# Patient Record
Sex: Female | Born: 1985 | Race: White | Hispanic: No | Smoking: Never smoker
Health system: Southern US, Community
[De-identification: ages and names within clinical notes are randomized; demographics above are authoritative.]

## PROBLEM LIST (undated history)

## (undated) DIAGNOSIS — Z8659 Personal history of other mental and behavioral disorders: Secondary | ICD-10-CM

## (undated) DIAGNOSIS — Z6791 Unspecified blood type, Rh negative: Secondary | ICD-10-CM

## (undated) DIAGNOSIS — N390 Urinary tract infection, site not specified: Secondary | ICD-10-CM

## (undated) HISTORY — DX: Personal history of other mental and behavioral disorders: Z86.59

## (undated) HISTORY — PX: OTHER SURGICAL HISTORY: SHX169

## (undated) HISTORY — DX: Unspecified blood type, rh negative: Z67.91

---
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
## 2006-11-16 ENCOUNTER — Other Ambulatory Visit: Admission: RE | Admit: 2006-11-16 | Discharge: 2006-11-16 | Payer: Self-pay | Admitting: *Deleted

## 2008-01-18 ENCOUNTER — Ambulatory Visit (HOSPITAL_COMMUNITY): Admission: RE | Admit: 2008-01-18 | Discharge: 2008-01-18 | Payer: Self-pay | Admitting: Obstetrics and Gynecology

## 2008-03-01 ENCOUNTER — Other Ambulatory Visit: Admission: RE | Admit: 2008-03-01 | Discharge: 2008-03-01 | Payer: Self-pay | Admitting: Obstetrics and Gynecology

## 2008-06-01 ENCOUNTER — Inpatient Hospital Stay (HOSPITAL_COMMUNITY): Admission: AD | Admit: 2008-06-01 | Discharge: 2008-06-01 | Payer: Self-pay | Admitting: Obstetrics and Gynecology

## 2008-08-06 ENCOUNTER — Inpatient Hospital Stay (HOSPITAL_COMMUNITY): Admission: AD | Admit: 2008-08-06 | Discharge: 2008-08-09 | Payer: Self-pay | Admitting: Obstetrics and Gynecology

## 2008-08-07 ENCOUNTER — Encounter (HOSPITAL_COMMUNITY): Payer: Self-pay | Admitting: Obstetrics and Gynecology

## 2008-10-08 ENCOUNTER — Encounter: Admission: RE | Admit: 2008-10-08 | Discharge: 2008-10-08 | Payer: Self-pay | Admitting: General Surgery

## 2010-02-17 ENCOUNTER — Other Ambulatory Visit: Admission: RE | Admit: 2010-02-17 | Discharge: 2010-02-17 | Payer: Self-pay | Admitting: Family Medicine

## 2010-10-26 LAB — CBC
HCT: 32.4 % — ABNORMAL LOW (ref 36.0–46.0)
HCT: 37.5 % (ref 36.0–46.0)
Hemoglobin: 12.7 g/dL (ref 12.0–15.0)
MCHC: 33.8 g/dL (ref 30.0–36.0)
MCHC: 34 g/dL (ref 30.0–36.0)
MCV: 95.4 fL (ref 78.0–100.0)
RBC: 3.4 MIL/uL — ABNORMAL LOW (ref 3.87–5.11)
RDW: 13.8 % (ref 11.5–15.5)

## 2011-02-05 ENCOUNTER — Encounter (HOSPITAL_COMMUNITY): Payer: Self-pay | Admitting: *Deleted

## 2011-02-05 ENCOUNTER — Inpatient Hospital Stay (HOSPITAL_COMMUNITY): Payer: Medicaid Other

## 2011-02-05 ENCOUNTER — Inpatient Hospital Stay (HOSPITAL_COMMUNITY)
Admission: AD | Admit: 2011-02-05 | Discharge: 2011-02-05 | Disposition: A | Discharge status: Home / Self Care | Payer: Medicaid Other | Source: Ambulatory Visit | Attending: Obstetrics and Gynecology | Admitting: Obstetrics and Gynecology

## 2011-02-05 DIAGNOSIS — Z6791 Unspecified blood type, Rh negative: Secondary | ICD-10-CM

## 2011-02-05 DIAGNOSIS — O209 Hemorrhage in early pregnancy, unspecified: Secondary | ICD-10-CM | POA: Insufficient documentation

## 2011-02-05 DIAGNOSIS — R7989 Other specified abnormal findings of blood chemistry: Secondary | ICD-10-CM | POA: Insufficient documentation

## 2011-02-05 DIAGNOSIS — Z789 Other specified health status: Secondary | ICD-10-CM | POA: Insufficient documentation

## 2011-02-05 DIAGNOSIS — O210 Mild hyperemesis gravidarum: Secondary | ICD-10-CM | POA: Insufficient documentation

## 2011-02-05 LAB — WET PREP, GENITAL
Clue Cells Wet Prep HPF POC: NONE SEEN
Trich, Wet Prep: NONE SEEN
Yeast Wet Prep HPF POC: NONE SEEN

## 2011-02-05 LAB — URINALYSIS, ROUTINE W REFLEX MICROSCOPIC
Bilirubin Urine: NEGATIVE
Glucose, UA: NEGATIVE mg/dL
Ketones, ur: NEGATIVE mg/dL
Leukocytes, UA: NEGATIVE
Protein, ur: NEGATIVE mg/dL

## 2011-02-05 MED ORDER — RHO D IMMUNE GLOBULIN 1500 UNIT/2ML IJ SOLN
300.0000 ug | Freq: Once | INTRAMUSCULAR | Status: AC
  Administered 2011-02-05: 300 ug via INTRAMUSCULAR

## 2011-02-05 NOTE — ED Provider Notes (Signed)
History    Pt is 25 y.o. WF G2P1001 at 7.6 weeks per LMP of 12/12/10 (EDC 09/18/11).   Chief Complaint  Patient presents with  . Vaginal Discharge   Vaginal Discharge The patient's primary symptoms include vaginal bleeding. This is a new problem. The current episode started today (Noticed when awoke this AM & only when wiping today). The problem occurs intermittently. The problem has been gradually worsening. The pain is mild (Reports recent "cramping" that describes as worse than ctxs on Pitocin). She is pregnant. Associated symptoms include nausea. The vaginal discharge was brown and mucoid. The vaginal bleeding is spotting. Passing clots: stringy. She has not been passing tissue. The symptoms are aggravated by intercourse (last incident Wednesday night). She has tried nothing for the symptoms. (Bell's palsy 3rd trimester last pregnancy--followed by neurologist & took prednisone & second agent & resolved; no residual issues or recurrences)    OB History    Grav Para Term Preterm Abortions TAB SAB Ect Mult Living   2 1 1       1       No past medical history on file.  Past Surgical History  Procedure Date  . Wisdom teeth      No family history on file.  History  Substance Use Topics  . Smoking status: Never Smoker   . Smokeless tobacco: Not on file  . Alcohol Use: No    Allergies: No Known Allergies  Prescriptions prior to admission  Medication Sig Dispense Refill  . Docosahexaenoic Acid (DHA OMEGA 3 PO) Take 1 capsule by mouth 2 (two) times daily.        . prenatal vitamin w/FE, FA (PRENATAL 1 + 1) 27-1 MG TABS Take 1 tablet by mouth daily.          Review of Systems  Constitutional: Negative.   Respiratory: Negative.   Cardiovascular: Negative.   Gastrointestinal: Positive for nausea.       Occasional; denies emesis  Genitourinary: Negative.    Physical Exam   Blood pressure 117/73, pulse 70, temperature 99 F (37.2 C), temperature source Oral, resp. rate 20, height  6\' 2"  (1.88 m), weight 50.066 kg (110 lb 6 oz).  Physical Exam  Constitutional: She is oriented to person, place, and time. She appears well-developed and well-nourished.  Eyes:       Eye glasses  GI: Soft. Bowel sounds are normal. There is no tenderness. There is no rebound.  Genitourinary: Vaginal discharge found.       Small amt of light brown-tinged discharged noted in vault. cx firm, closed.  Vulva red.  Positive odor.  Neurological: She is alert and oriented to person, place, and time.  Skin: Skin is warm and dry.       Facial acne    MAU Course  Procedures 1.  Speculum exam--Wet Prep WNL; GC/CT cx's pending 2.  Bimanual exam 3.  OB u/s:  SIUP with crl=8 wks (per LMP 7.6 weeks); FHR =176bpm; +YS; "Lg SCH (>50%); Rt ovary w/ simple-appearing cyst =3.6x2.9.2.8cm; Lt ov & cx nml.  Adnexa nml. 4.  Rhopylac w/u and injection received 5.  Urinalysis WNL Assessment and Plan  1.  IUP at 7.6 wks 2.  RH neg 3.  1st trimester vaginal bleeding w/ lg SCH on u/s  After results reviewed, pt d/c'd home pelvic rest until 7 days without vaginal bleeding.  SAB & bleeding precautions rev'd. F/u 02/10/11 as scheduled or prn.  Continue PNV & Fish oil.   Wilman Tucker  H 02/05/2011, 10:47 PM

## 2011-02-05 NOTE — Progress Notes (Signed)
Pt states, " This morning I started having brown discharge when I wipe. It has gradually gotten heavier as the day went on. I'm not having any cramping."

## 2011-02-06 LAB — RH IG WORKUP (INCLUDES ABO/RH): Unit division: 0

## 2011-02-06 LAB — GC/CHLAMYDIA PROBE AMP, GENITAL: Chlamydia, DNA Probe: NEGATIVE

## 2011-03-16 ENCOUNTER — Other Ambulatory Visit (HOSPITAL_COMMUNITY): Payer: Self-pay | Admitting: Obstetrics and Gynecology

## 2011-03-16 DIAGNOSIS — Z3682 Encounter for antenatal screening for nuchal translucency: Secondary | ICD-10-CM

## 2011-03-17 ENCOUNTER — Ambulatory Visit (HOSPITAL_COMMUNITY)
Admission: RE | Admit: 2011-03-17 | Discharge: 2011-03-17 | Disposition: A | Discharge status: Home / Self Care | Payer: Medicaid Other | Source: Ambulatory Visit | Attending: Obstetrics and Gynecology | Admitting: Obstetrics and Gynecology

## 2011-03-17 ENCOUNTER — Encounter (HOSPITAL_COMMUNITY): Payer: Self-pay

## 2011-03-17 ENCOUNTER — Other Ambulatory Visit: Payer: Self-pay

## 2011-03-17 DIAGNOSIS — O3510X0 Maternal care for (suspected) chromosomal abnormality in fetus, unspecified, not applicable or unspecified: Secondary | ICD-10-CM | POA: Insufficient documentation

## 2011-03-17 DIAGNOSIS — Z3682 Encounter for antenatal screening for nuchal translucency: Secondary | ICD-10-CM

## 2011-03-17 DIAGNOSIS — O351XX0 Maternal care for (suspected) chromosomal abnormality in fetus, not applicable or unspecified: Secondary | ICD-10-CM | POA: Insufficient documentation

## 2011-03-17 DIAGNOSIS — Z3689 Encounter for other specified antenatal screening: Secondary | ICD-10-CM | POA: Insufficient documentation

## 2011-04-13 LAB — URINALYSIS, ROUTINE W REFLEX MICROSCOPIC
Bilirubin Urine: NEGATIVE
Ketones, ur: NEGATIVE
Nitrite: NEGATIVE
Protein, ur: NEGATIVE

## 2011-07-05 ENCOUNTER — Inpatient Hospital Stay (HOSPITAL_COMMUNITY)
Admission: AD | Admit: 2011-07-05 | Discharge: 2011-07-05 | Disposition: A | Discharge status: Home / Self Care | Payer: Medicaid Other | Source: Ambulatory Visit | Attending: Obstetrics and Gynecology | Admitting: Obstetrics and Gynecology

## 2011-07-05 DIAGNOSIS — Z298 Encounter for other specified prophylactic measures: Secondary | ICD-10-CM | POA: Insufficient documentation

## 2011-07-05 DIAGNOSIS — Z2989 Encounter for other specified prophylactic measures: Secondary | ICD-10-CM | POA: Insufficient documentation

## 2011-07-05 DIAGNOSIS — Z3480 Encounter for supervision of other normal pregnancy, unspecified trimester: Secondary | ICD-10-CM | POA: Insufficient documentation

## 2011-07-05 MED ORDER — RHO D IMMUNE GLOBULIN 1500 UNIT/2ML IJ SOLN
300.0000 ug | Freq: Once | INTRAMUSCULAR | Status: AC
  Administered 2011-07-05: 300 ug via INTRAMUSCULAR
  Filled 2011-07-05: qty 2

## 2011-07-05 NOTE — Progress Notes (Signed)
Here for rhophyllac injection. Had with first preg, and early in this preg when bleeding.  No problem when received previously.  Is 29wks.  edc 09/18/11.  Time associated with blood draw and injection discussed

## 2011-07-05 NOTE — ED Notes (Signed)
No adverse effect from Rhophylac, pt states no questions at this time.

## 2011-07-06 LAB — RH IG WORKUP (INCLUDES ABO/RH): Unit division: 0

## 2011-07-13 NOTE — L&D Delivery Note (Signed)
Delivery Note At 8:09 PM a viable female, "Alexandra Sullivan", was delivered via Vaginal, Spontaneous Delivery (Presentation: Left Occiput Anterior).  APGAR: 9, ; weight .  Delivered in MAU. Placenta status: Intact, Spontaneous.  Cord: 3 vessels with the following complications: Slightly short cord .  Cord pH: NA Arrived just after SROM, clear fluid per patient report.  Patient complete on arrival to MAU, rapid progression to delivery.  Anesthesia: Local  Episiotomy: None Lacerations: 1st degree;Perineal Suture Repair: 3.0 monocryl Est. Blood Loss (mL):   Mom to postpartum.  Baby to skin to skin in MAU.  Nigel Bridgeman 09/09/2011, 9:26 PM

## 2011-08-23 ENCOUNTER — Inpatient Hospital Stay (HOSPITAL_COMMUNITY)
Admission: AD | Admit: 2011-08-23 | Discharge: 2011-08-23 | Disposition: A | Discharge status: Home / Self Care | Payer: Medicaid Other | Source: Ambulatory Visit | Attending: Obstetrics and Gynecology | Admitting: Obstetrics and Gynecology

## 2011-08-23 ENCOUNTER — Inpatient Hospital Stay (HOSPITAL_COMMUNITY): Payer: Medicaid Other

## 2011-08-23 ENCOUNTER — Other Ambulatory Visit: Payer: Self-pay | Admitting: Obstetrics and Gynecology

## 2011-08-23 ENCOUNTER — Encounter (HOSPITAL_COMMUNITY): Payer: Self-pay | Admitting: *Deleted

## 2011-08-23 DIAGNOSIS — O368190 Decreased fetal movements, unspecified trimester, not applicable or unspecified: Secondary | ICD-10-CM | POA: Insufficient documentation

## 2011-08-23 NOTE — ED Notes (Signed)
FHR tracing is reassuring but not reactive yet; Having ucs that are 1-3 minutes apart; pt is not feeling ucs; pt feels vaginal pressure occasionally;

## 2011-08-23 NOTE — Progress Notes (Signed)
Pt was really active yesterday so was unaware of FM.  When laid down last night, had less movement and again today, not as often or as strong.

## 2011-08-24 ENCOUNTER — Other Ambulatory Visit: Payer: Self-pay | Admitting: Obstetrics and Gynecology

## 2011-08-24 NOTE — ED Provider Notes (Signed)
History    26 yo G2P1001 at 28 2/7 weeks presented c/o decreased FM last 24 hours.  Denies leaking or bleeding, reports +FM. Denies contractions.  Pregnancy remarkable for: Rh negative Anxiety Plans waterbirth   OB History    Grav Para Term Preterm Abortions TAB SAB Ect Mult Living   2 1 1       1      Medical history:  Non-contributory   Past Surgical History  Procedure Date  . Wisdom teeth      Family History  Problem Relation Age of Onset  . Diabetes Maternal Grandmother   . Diabetes Maternal Grandfather   . Diabetes Paternal Grandmother   . Hypertension Paternal Grandmother   . Diabetes Paternal Grandfather     History  Substance Use Topics  . Smoking status: Never Smoker   . Smokeless tobacco: Not on file  . Alcohol Use: No    Allergies: No Known Allergies  Physical Exam   Chest clear  Heart RRR without murmur Abd gravid, NT Pelvic--closed, 50%, vtx -2 Ext WNL  FHR reassuring, negative spontaneous CST. UCs q 2-5, mild.  [place]:  Verbal report from [place]:  Vtx, AFI WNL.  EFW c/w [redacted] week gestation, BPP 8/8 (written report not available yet in EPIC).  FHR reactive on monitor after [place]. Now aware of FM     ED Course  IUP at 36 2/7 weeks Reassuring fetal status  Plan: D/C home with [facility] discussion, labor precautions. F/U as scheduled at [facility] or prn.  Nigel Bridgeman, CNM, [place] 08/23/11 11pm

## 2011-09-09 ENCOUNTER — Inpatient Hospital Stay (HOSPITAL_COMMUNITY)
Admission: AD | Admit: 2011-09-09 | Discharge: 2011-09-11 | DRG: 775 | Disposition: A | Discharge status: Home Health | Payer: Medicaid Other | Attending: Obstetrics and Gynecology | Admitting: Obstetrics and Gynecology

## 2011-09-09 ENCOUNTER — Encounter (HOSPITAL_COMMUNITY): Payer: Self-pay | Admitting: *Deleted

## 2011-09-09 MED ORDER — BENZOCAINE-MENTHOL 20-0.5 % EX AERO: 1.0000 "application " | INHALATION_SPRAY | CUTANEOUS | Status: DC | PRN

## 2011-09-09 MED ORDER — MEASLES, MUMPS & RUBELLA VAC ~~LOC~~ INJ
0.5000 mL | INJECTION | Freq: Once | SUBCUTANEOUS | Status: DC
  Filled 2011-09-09: qty 0.5

## 2011-09-09 MED ORDER — ZOLPIDEM TARTRATE 5 MG PO TABS: 5.0000 mg | ORAL_TABLET | Freq: Every evening | ORAL | Status: DC | PRN

## 2011-09-09 MED ORDER — MAGNESIUM HYDROXIDE 400 MG/5ML PO SUSP: 30.0000 mL | ORAL | Status: DC | PRN

## 2011-09-09 MED ORDER — ONDANSETRON HCL 4 MG/2ML IJ SOLN: 4.0000 mg | INTRAMUSCULAR | Status: DC | PRN

## 2011-09-09 MED ORDER — SENNOSIDES-DOCUSATE SODIUM 8.6-50 MG PO TABS
2.0000 | ORAL_TABLET | Freq: Every day | ORAL | Status: DC
  Administered 2011-09-10: 2 via ORAL

## 2011-09-09 MED ORDER — SIMETHICONE 80 MG PO CHEW: 80.0000 mg | CHEWABLE_TABLET | ORAL | Status: DC | PRN

## 2011-09-09 MED ORDER — OXYTOCIN 10 UNIT/ML IJ SOLN
10.0000 [IU] | Freq: Once | INTRAMUSCULAR | Status: AC
  Administered 2011-09-09: 10 [IU] via INTRAMUSCULAR
  Filled 2011-09-09: qty 1

## 2011-09-09 MED ORDER — IBUPROFEN 600 MG PO TABS
600.0000 mg | ORAL_TABLET | Freq: Four times a day (QID) | ORAL | Status: DC
  Administered 2011-09-09 – 2011-09-11 (×7): 600 mg via ORAL
  Filled 2011-09-09 (×6): qty 1

## 2011-09-09 MED ORDER — OXYCODONE-ACETAMINOPHEN 5-325 MG PO TABS: 1.0000 | ORAL_TABLET | ORAL | Status: DC | PRN

## 2011-09-09 MED ORDER — PRENATAL MULTIVITAMIN CH
1.0000 | ORAL_TABLET | Freq: Every day | ORAL | Status: DC
  Administered 2011-09-10 – 2011-09-11 (×2): 1 via ORAL
  Filled 2011-09-09 (×2): qty 1

## 2011-09-09 MED ORDER — TETANUS-DIPHTH-ACELL PERTUSSIS 5-2.5-18.5 LF-MCG/0.5 IM SUSP: 0.5000 mL | Freq: Once | INTRAMUSCULAR | Status: DC

## 2011-09-09 MED ORDER — DIPHENHYDRAMINE HCL 25 MG PO CAPS: 25.0000 mg | ORAL_CAPSULE | Freq: Four times a day (QID) | ORAL | Status: DC | PRN

## 2011-09-09 MED ORDER — WITCH HAZEL-GLYCERIN EX PADS: 1.0000 "application " | MEDICATED_PAD | CUTANEOUS | Status: DC | PRN

## 2011-09-09 MED ORDER — LANOLIN HYDROUS EX OINT: TOPICAL_OINTMENT | CUTANEOUS | Status: DC | PRN

## 2011-09-09 MED ORDER — DIBUCAINE 1 % RE OINT: 1.0000 "application " | TOPICAL_OINTMENT | RECTAL | Status: DC | PRN

## 2011-09-09 MED ORDER — LIDOCAINE HCL (PF) 1 % IJ SOLN
INTRAMUSCULAR | Status: AC
  Administered 2011-09-09: 30 mL
  Filled 2011-09-09: qty 30

## 2011-09-09 MED ORDER — ONDANSETRON HCL 4 MG PO TABS: 4.0000 mg | ORAL_TABLET | ORAL | Status: DC | PRN

## 2011-09-09 NOTE — ED Notes (Signed)
Nursing nurse notified of term delivery in MAU; 38.5 weeks, rapidly delivered in MAU. Pt and Midwife requests baby to stay with mom without going to the nursery. Nursing nurse will come to MAU when available. Family declines erythromycin for the baby. Midwife V. [facility] CNM aware.

## 2011-09-09 NOTE — ED Notes (Signed)
Manfred Arch CNM notified of increased amount of bleeding from scant to moderate. Last check uterus was firm with massage. Orders to given Pitocin 10units IM.

## 2011-09-09 NOTE — ED Notes (Signed)
Patient arrived at MAU at 2001 complaining of the need to push. Manfred Arch notified. Patient completely dilated upon exam. Pushed initiated with Manfred Arch CNM at bedside.  NBF delivered at 2009

## 2011-09-10 LAB — CBC
HCT: 34.7 % — ABNORMAL LOW (ref 36.0–46.0)
MCHC: 34.3 g/dL (ref 30.0–36.0)
MCV: 90.6 fL (ref 78.0–100.0)
Platelets: 223 10*3/uL (ref 150–400)
RDW: 12.8 % (ref 11.5–15.5)
WBC: 20.6 10*3/uL — ABNORMAL HIGH (ref 4.0–10.5)

## 2011-09-10 NOTE — H&P (Signed)
Alexandra Sullivan is a 26 y.o. female , G2P1001 at 70 5/7 weeks, presenting in active labor, with recent SROM on way to hospital, clear fluid.  Patient feels urge to push on arrival.  Accompanied by doula and husband.  Pregnancy remarkable for: RH negative, received Rhophylac during pregnancy Anxiety disorder 1st trimester bleeding  GBS negative Planned waterbirth  History of present pregnancy: Patient entered care at 8 weeks.  EDC of 09/17/11 was established by LMP and 1st trimester [place].  Anatomy scan was done at 18  weeks, with normal findings and an anterior placenta.  Her prenatal course was essentially uncomplicated.  Further signficant events were negative GBS.  She attended WB class and signed a consent for same.  OB History    Grav Para Term Preterm Abortions TAB SAB Ect Mult Living   2 2 2       2     #1--2010 SVB, long labor. #2--current  No past medical history on file.  No significant medical history  Past Surgical History  Procedure Date  . Wisdom teeth     Family History: family history includes Diabetes in her maternal grandfather, maternal grandmother, paternal grandfather, and paternal grandmother and Hypertension in her paternal grandmother.  Social History:  reports that she has never smoked. She does not have any smokeless tobacco history on file. She reports that she does not drink alcohol or use illicit drugs.  FOB is involved and supportive.    ROS:  Contractions, urge to push, leaking clear fluid, positive FM    Blood pressure 136/81, pulse 77, temperature 98.8 F (37.1 C), temperature source Oral, resp. rate 20, last menstrual period 12/12/2010, unknown if currently breastfeeding.  Chest clear Heart RRR without murmur Abd gravid, NT, frequent UCs Pelvic--complete, vtx +2 station, clear fluid leaking Ext WNL  FHR 140s by doppler, some variables with UCs, rapid recovery with end of contraction  Prenatal labs: ABO, Rh: --/--/O NEG (12/24 1220) Antibody:  NEG (12/24 1220) Rubella:  Immune RPR:   NR HBsAg:   Neg HIV:   Neg GBS:   Neg Normal glucola Normal Hgb Declined genetic screenings  Assessment/Plan: IUP at 38 5/7 weeks Second stage labor GBS negative  Plan: Admitted to [facility]. Delivered in MAU Routine CNM orders  Sanel Stemmer 09/10/2011, 2:30 AM

## 2011-09-10 NOTE — Progress Notes (Signed)
Post Partum Day 1 Subjective: no complaints, up ad lib and tolerating PO  Objective: Blood pressure 111/71, pulse 68, temperature 98.5 F (36.9 C), temperature source Oral, resp. rate 18, last menstrual period 12/12/2010, unknown if currently breastfeeding.  Physical Exam:  General: alert and cooperative Lochia: appropriate Uterine Fundus: firm Incision: NA DVT Evaluation: No evidence of DVT seen on physical exam.   Basename 09/10/11 0533  HGB 11.9*  HCT 34.7*    Assessment/Plan: Plan for discharge tomorrow Vasectomy planned.   LOS: 1 day   Alexandra Sullivan V 09/10/2011, 9:47 AM

## 2011-09-10 NOTE — Progress Notes (Signed)
UR Chart review completed.  

## 2011-09-11 MED ORDER — IBUPROFEN 600 MG PO TABS: 600.0000 mg | ORAL_TABLET | Freq: Four times a day (QID) | ORAL | Status: AC

## 2011-09-11 NOTE — Discharge Summary (Signed)
Obstetric Discharge Summary Reason for Admission: onset of labor Prenatal Procedures: ultrasound Intrapartum Procedures: spontaneous vaginal delivery Postpartum Procedures: none Complications-Operative and Postpartum: 1st degree perineal laceration Hemoglobin  Date Value Range Status  09/10/2011 11.9* 12.0-15.0 (g/dL) Final     HCT  Date Value Range Status  09/10/2011 34.7* 36.0-46.0 (%) Final    Discharge Diagnoses: Term Pregnancy-delivered  Discharge Information: Date: 09/11/2011 Activity: unrestricted Diet: routine Medications: PNV and Ibuprofen Condition: stable Instructions: refer to practice specific booklet Discharge to: home Follow-up Information    Follow up with [facility] in 6 weeks.       Contraception:  Plans vasectomy  Newborn Data: Live born female  Birth Weight: 6 lb 2.4 oz (2790 g) APGAR: 9,   Home with mother.  Alexandra Petite O. 09/11/2011, 12:23 PM

## 2011-09-11 NOTE — Progress Notes (Addendum)
Post Partum Day 2 Subjective: Feels well.  Ambulating, voiding and tol po liquids and solids without difficulty.  Denies weakness or dizziness.  Breastfeeding without difficulty.  Pos flatus and pos BM.  Reports minimal perineal pain relieved with Motrin.  States she is very happy with her birth experience.    Objective: Blood pressure 107/76, pulse 76, temperature 98 F (36.7 C), temperature source Axillary, resp. rate 16, last menstrual period 12/12/2010, unknown if currently breastfeeding.  Physical Exam:  General: alert, cooperative and no distress Heart:  RRR Lungs:  CTA bilat Abd:  Soft, NT, BS x 4 quads. Lochia: appropriate, scant rubra Uterine Fundus: firm, NT 2 below umb Incision: healing well, perineum intact DVT Evaluation: No evidence of DVT seen on physical exam. Negative Homan's sign. No significant calf/ankle edema.   Basename 09/10/11 0533  HGB 11.9*  HCT 34.7*    Assessment/Plan: Stable s/p vag delivery  Discharge to home. Discharge instructions reviewed. RTO 6wks for f/u. Plans vasectomy for contraception. Rx Motrin, PNV.   LOS: 2 days   Alexandra Sullivan O. 09/11/2011, 12:24 PM

## 2011-09-11 NOTE — Discharge Instructions (Signed)
See Practice Brochure 

## 2011-09-11 NOTE — Progress Notes (Signed)

## 2011-10-18 ENCOUNTER — Encounter (INDEPENDENT_AMBULATORY_CARE_PROVIDER_SITE_OTHER): Payer: Medicaid Other | Admitting: Registered Nurse

## 2011-10-18 ENCOUNTER — Other Ambulatory Visit: Payer: Self-pay | Admitting: Registered Nurse

## 2011-10-18 ENCOUNTER — Inpatient Hospital Stay (HOSPITAL_COMMUNITY)
Admission: AD | Admit: 2011-10-18 | Discharge: 2011-10-18 | Disposition: A | Discharge status: Hospice | Payer: Medicaid Other | Source: Ambulatory Visit | Attending: Obstetrics and Gynecology | Admitting: Obstetrics and Gynecology

## 2011-10-18 ENCOUNTER — Encounter (HOSPITAL_COMMUNITY): Payer: Self-pay | Admitting: *Deleted

## 2011-10-18 DIAGNOSIS — O99893 Other specified diseases and conditions complicating puerperium: Secondary | ICD-10-CM | POA: Insufficient documentation

## 2011-10-18 DIAGNOSIS — N390 Urinary tract infection, site not specified: Secondary | ICD-10-CM

## 2011-10-18 DIAGNOSIS — O2390 Unspecified genitourinary tract infection in pregnancy, unspecified trimester: Secondary | ICD-10-CM

## 2011-10-18 DIAGNOSIS — M545 Low back pain, unspecified: Secondary | ICD-10-CM | POA: Insufficient documentation

## 2011-10-18 HISTORY — DX: Urinary tract infection, site not specified: N39.0

## 2011-10-18 LAB — URINALYSIS, ROUTINE W REFLEX MICROSCOPIC
Glucose, UA: NEGATIVE mg/dL
Ketones, ur: 15 mg/dL — AB
Protein, ur: NEGATIVE mg/dL
Urobilinogen, UA: 0.2 mg/dL (ref 0.0–1.0)

## 2011-10-18 LAB — URINE MICROSCOPIC-ADD ON

## 2011-10-18 MED ORDER — OXYCODONE-ACETAMINOPHEN 5-325 MG PO TABS: 1.0000 | ORAL_TABLET | ORAL | Status: AC | PRN

## 2011-10-18 NOTE — Discharge Instructions (Signed)

## 2011-10-18 NOTE — Progress Notes (Signed)
Presents 5 week pp seen in office today RX for UTI, Keflex, had back pain so I thought it was kidney infection and came in, pain is gone now" O VSS     abd soft, nt     Neg CVAT bilaterally A UTI P discussed rx keflex, RX percocet given, f/o office culture next week with PP visit. Lavera Guise, CNM

## 2011-10-18 NOTE — MAU Note (Signed)
Patient is here with c/o lower back pain. She states that she was at [facility] for her pp 5weeks visit. She was told that she has uti and was dehydrated. She states that she was given rx for keflex. When she got home she started lower back pain. She called without a response. She is breast feeding.

## 2011-10-19 LAB — CBC WITH DIFFERENTIAL/PLATELET
Basophils Absolute: 0 10*3/uL (ref 0.0–0.1)
Eosinophils Relative: 3 % (ref 0–5)
HCT: 39.6 % (ref 36.0–46.0)
Hemoglobin: 13.2 g/dL (ref 12.0–15.0)
Lymphocytes Relative: 26 % (ref 12–46)
Lymphs Abs: 2.4 10*3/uL (ref 0.7–4.0)
MCV: 91.2 fL (ref 78.0–100.0)
Monocytes Absolute: 0.5 10*3/uL (ref 0.1–1.0)
Monocytes Relative: 6 % (ref 3–12)
Neutro Abs: 6.1 10*3/uL (ref 1.7–7.7)
RBC: 4.34 MIL/uL (ref 3.87–5.11)
RDW: 11.6 % (ref 11.5–15.5)
WBC: 9.3 10*3/uL (ref 4.0–10.5)

## 2011-10-21 DIAGNOSIS — Z6791 Unspecified blood type, Rh negative: Secondary | ICD-10-CM | POA: Insufficient documentation

## 2011-10-21 DIAGNOSIS — O468X9 Other antepartum hemorrhage, unspecified trimester: Secondary | ICD-10-CM | POA: Insufficient documentation

## 2011-10-21 DIAGNOSIS — N390 Urinary tract infection, site not specified: Secondary | ICD-10-CM

## 2011-10-21 DIAGNOSIS — O418X90 Other specified disorders of amniotic fluid and membranes, unspecified trimester, not applicable or unspecified: Secondary | ICD-10-CM | POA: Insufficient documentation

## 2011-10-21 DIAGNOSIS — R7989 Other specified abnormal findings of blood chemistry: Secondary | ICD-10-CM

## 2011-10-21 DIAGNOSIS — F419 Anxiety disorder, unspecified: Secondary | ICD-10-CM

## 2011-10-21 DIAGNOSIS — O209 Hemorrhage in early pregnancy, unspecified: Secondary | ICD-10-CM

## 2011-10-22 ENCOUNTER — Ambulatory Visit (INDEPENDENT_AMBULATORY_CARE_PROVIDER_SITE_OTHER): Payer: Medicaid Other | Admitting: Obstetrics and Gynecology

## 2011-10-22 VITALS — BP 102/70 | HR 68 | Temp 98.1°F | Resp 14 | Ht 61.0 in | Wt 130.0 lb

## 2011-10-22 DIAGNOSIS — N390 Urinary tract infection, site not specified: Secondary | ICD-10-CM

## 2011-10-22 LAB — POCT URINALYSIS DIPSTICK
Bilirubin, UA: NEGATIVE
Glucose, UA: NEGATIVE
Spec Grav, UA: 1.01
Urobilinogen, UA: NEGATIVE

## 2011-10-22 NOTE — Progress Notes (Signed)
Subjective:     Alexandra Sullivan is a 26 y.o. female who presents for a postpartum visit.   She is 6 week postpartum following a spontaneous vaginal delivery at term, with a precipitous delivery in MAU.  I have fully reviewed the prenatal and intrapartum course.    Patient is sexually active.  Contraception method desired is vasectomy.  Postpartum depression screening: negative, with PPDS = 2  The following portions of the patient's history were reviewed and updated as appropriate: allergies, current medications, past family history, past medical history, past social history, past surgical history and problem list.  Review of Systems Pertinent items are noted in HPI.   Objective:    BP 102/70  Pulse 68  Temp 98.1 F (36.7 C)  Resp 14  Ht 5\' 1"  (1.549 m)  Wt 130 lb (58.968 kg)  BMI 24.56 kg/m2  Breastfeeding? Yes  General:  alert, cooperative and no distress     Lungs: clear to auscultation bilaterally  Heart:  regular rate and rhythm, S1, S2 normal, no murmur  Abdomen: soft, non-tender; bowel sounds normal; no masses,  no organomegaly   Vulva:  normal  Vagina: normal vagina  Cervix:  normal  Corpus: normal size, contour, position, consistency, mobility, non-tender  Adnexa:  normal adnexa             Assessment:     Normal postpartum exam.  Pap smear not done at today's visit.   Plan:     1. Contraception: vasectomy 3. Follow up in: 6 to 12 months or as needed--needs pap within the next 6-12 months.    Nigel Bridgeman CNM 10/22/2011 1:02 PM

## 2011-10-22 NOTE — Progress Notes (Signed)
Patient ID: Alexandra Sullivan, female   DOB: December 26, 1985, 26 y.o.   MRN: [id number] Date of delivery: 09/09/2011 Female Name: Marilynne Halsted Vaginal delivery:yes Cesarean section:no Tubal ligation:no GDM:no Breast Feeding:yes Bottle Feeding:no Post-Partum Blues:no Abnormal pap:no Normal GU function: yes Normal GI function:no Returning to work:no  Discussed contraception.  Husband is going to get vasectomy.

## 2011-10-24 IMAGING — US US OB COMP LESS 14 WK
1 series · 13 of 28 positions shown · non-contrast
Comparison: none

[Series 1: us ob comp less 14 wks · 34 acquisitions, 13 frames shown]
[im 2/34]
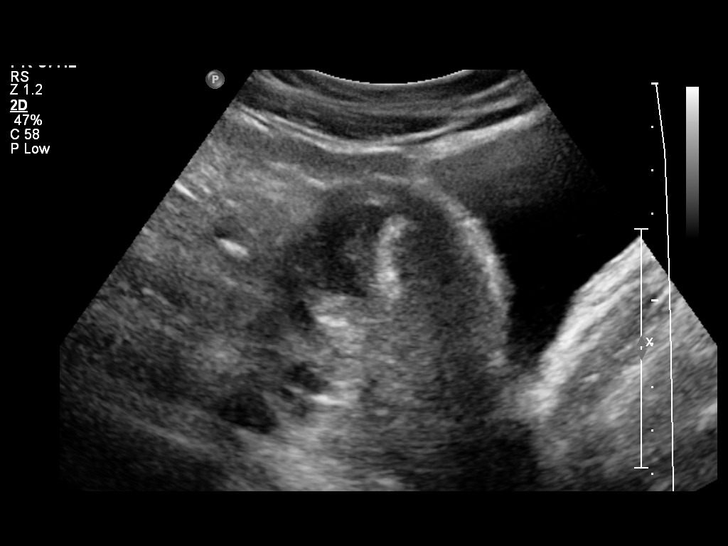
[im 4/34]
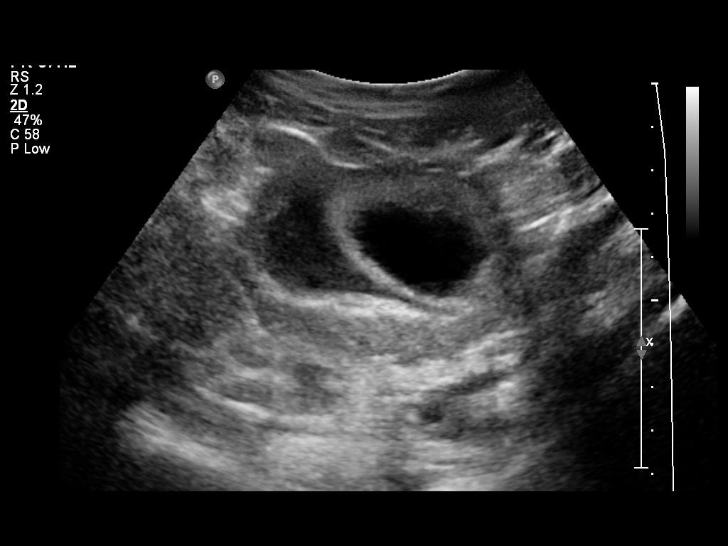
[im 7/34]
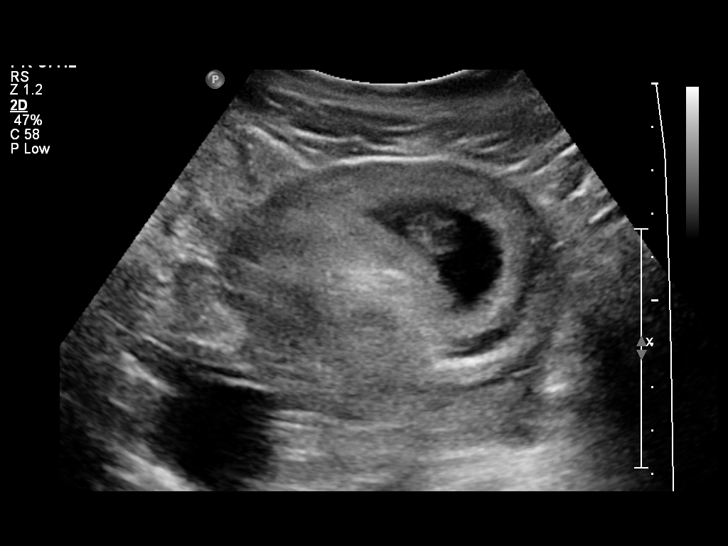
[im 9/34]
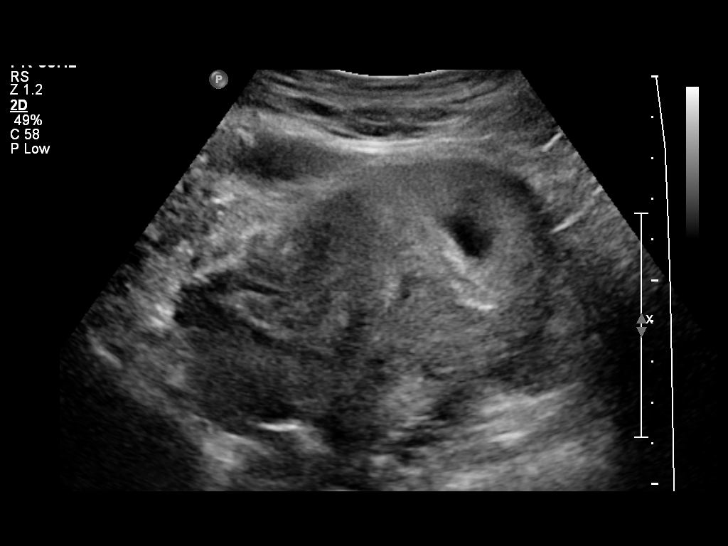
[im 12/34]
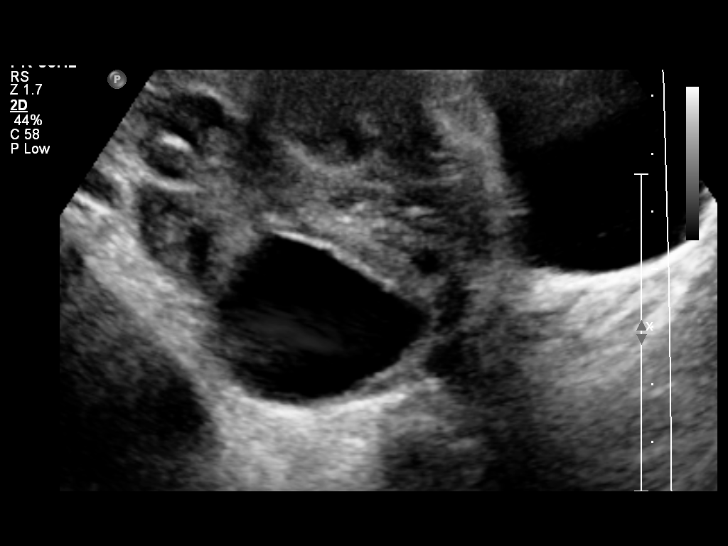
[im 14/34]
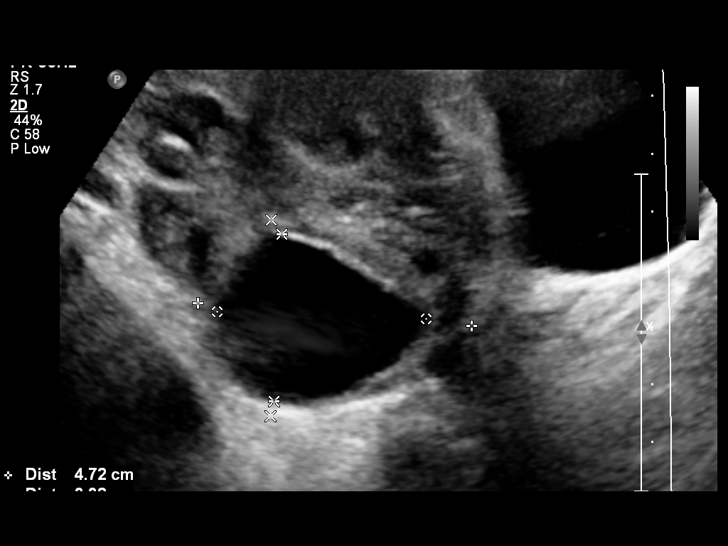
[im 18/34]
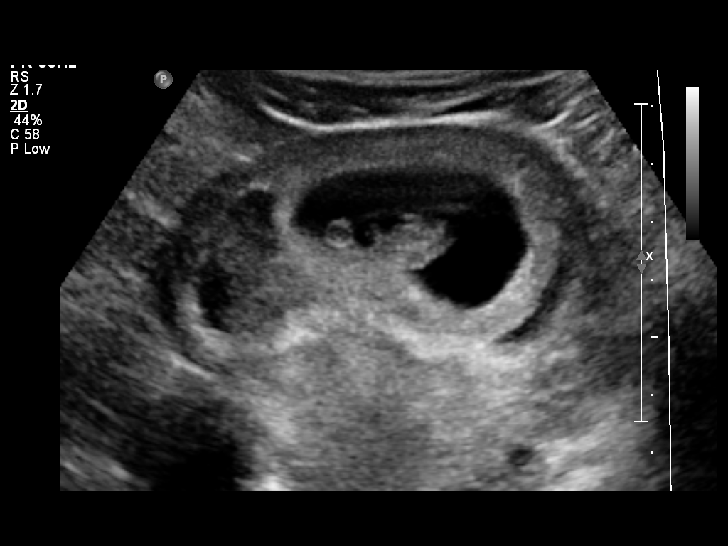
[im 20/34]
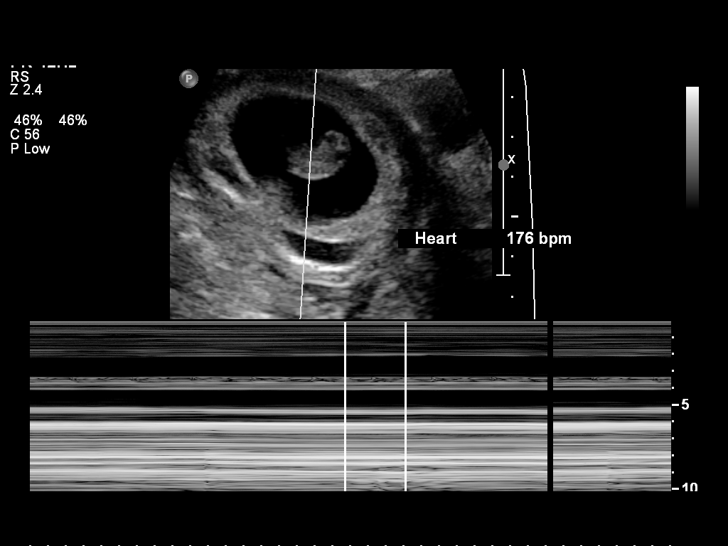
[im 23/34]
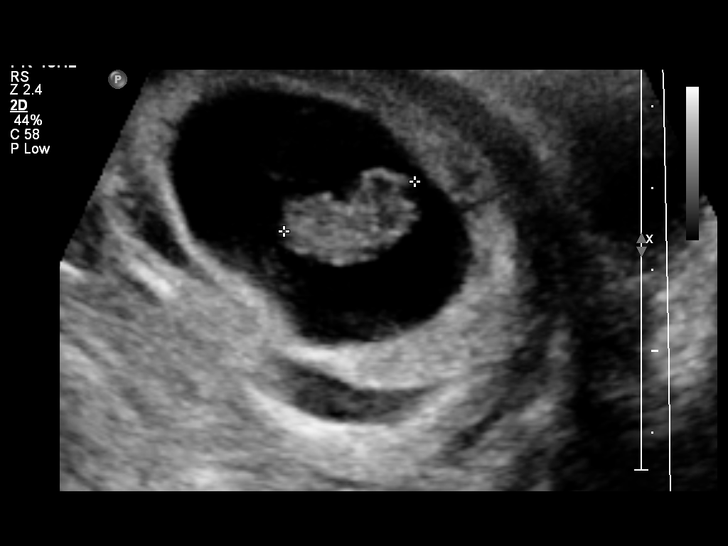
[im 25/34]
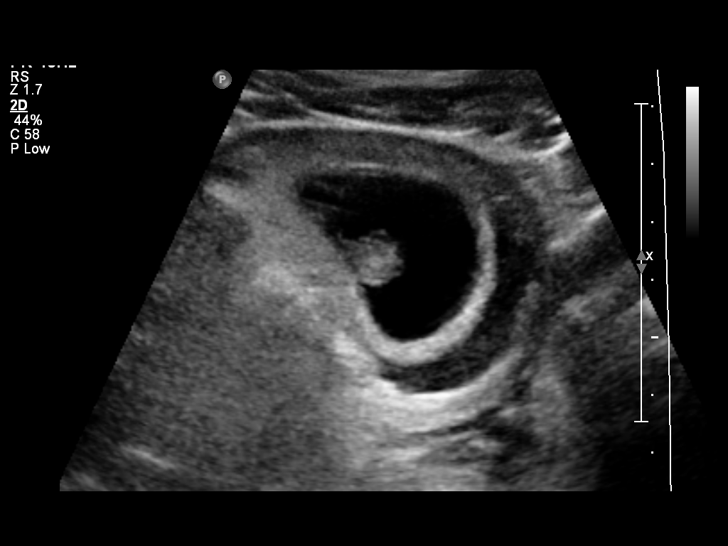
[im 27/34]
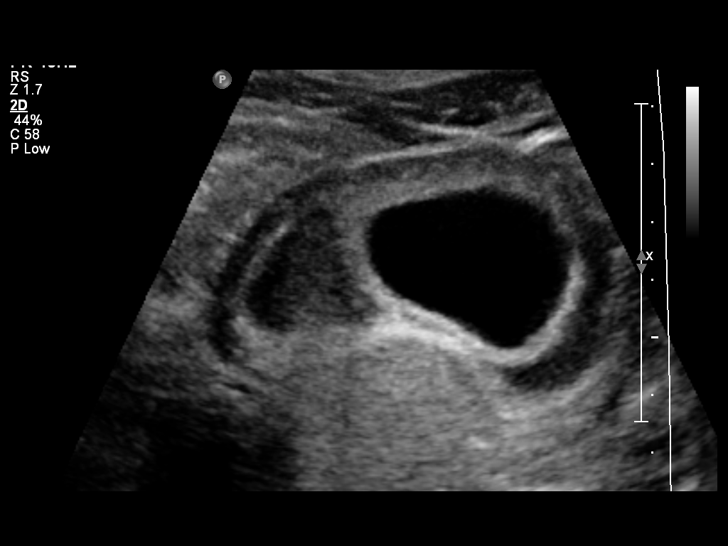
[im 30/34]
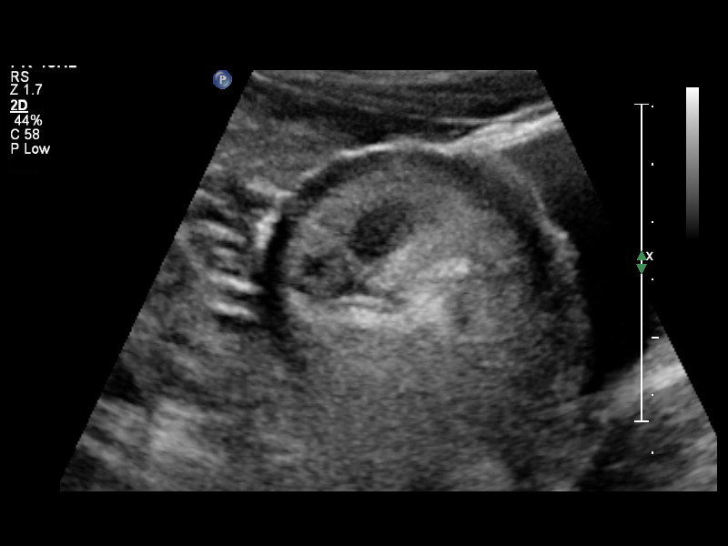
[im 32/34]
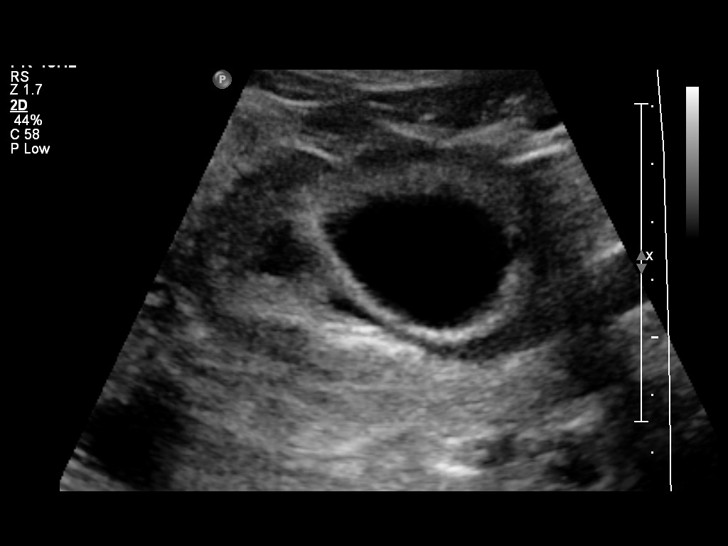

[13 of 28 positions shown; findings below may reference images not displayed]

OBSTETRICS REPORT
                      (Signed Final 02/05/2011 [DATE])

Procedures

 US OB COMP LESS 14 WKS                                76801.0
Indications

 No fetal cardiac activity detected;  assess viability
 Pain - Abdominal/Pelvic
Fetal Evaluation

 Preg. Location:    Intrauterine
 Gest. Sac:         Intrauterine, large
                    subchorionic bleed
 Yolk Sac:          Visualized
 Fetal Pole:        Visualized
 Fetal Heart Rate:  176                          bpm
 Cardiac Activity:  Observed

 Amniotic Fluid
 AFI FV:      Subjectively within normal limits
Biometry

 CRL:     17.1  mm     G. Age:  8w 0d                  EDD:    09/17/11
Gestational Age

 LMP:           7w 6d         Date:  12/12/10                 EDD:   09/18/11
 Best:          7w 6d      Det. By:  LMP  (12/12/10)          EDD:   09/18/11
Cervix Uterus Adnexa

 Cervix:       Normal appearance by transabdominal scan.
 Uterus:       Large subchorionic collection (>50%).
 Cul De Sac:   No free fluid seen.

 Left Ovary:    Within normal limits.
 Right Ovary:   Within normal limits with 3.6 x 2.9 x 2.8 cm simple-
                appearing cyst.
 Adnexa:     No abnormality visualized.
Impression

 Intrauterine gestational sac, yolk sac, fetal pole, and cardiac
 activity noted.  Concordant measurements/assigned GA by
 LMP.
 Large subchorionic hemorrhage noted.

## 2011-10-25 LAB — URINE CULTURE

## 2011-12-01 ENCOUNTER — Encounter: Payer: Self-pay | Admitting: Obstetrics and Gynecology

## 2011-12-01 ENCOUNTER — Ambulatory Visit (INDEPENDENT_AMBULATORY_CARE_PROVIDER_SITE_OTHER): Payer: Medicaid Other | Admitting: Obstetrics and Gynecology

## 2011-12-01 VITALS — BP 100/76 | Resp 16 | Wt 125.0 lb

## 2011-12-01 DIAGNOSIS — Z0000 Encounter for general adult medical examination without abnormal findings: Secondary | ICD-10-CM

## 2011-12-01 DIAGNOSIS — Z124 Encounter for screening for malignant neoplasm of cervix: Secondary | ICD-10-CM

## 2011-12-01 NOTE — Progress Notes (Signed)
Alexandra Sullivan is a 26 y.o. female presenting for annual exam breastfeeding and providing milk for 3 other families, L breast red and sore yesterday and fine today no fever. Boyfriend getting vasectomy next month, not using any contraception   @IPILAPH @ OB History    Grav Para Term Preterm Abortions TAB SAB Ect Mult Living   2 2 2       2      Past Medical History  Diagnosis Date  . UTI (lower urinary tract infection)   . Blood type, Rh negative   . H/O anxiety disorder    Past Surgical History  Procedure Date  . Wisdom teeth      age 63   Family History: family history includes Cancer in her paternal grandmother; Diabetes in her maternal grandfather, maternal grandmother, paternal grandfather, and paternal grandmother; and Hypertension in her maternal grandfather, maternal grandmother, paternal grandfather, and paternal grandmother. Social History: single, stays at home with children    Blood pressure 100/76, resp. rate 16, weight 125 lb (56.7 kg), last menstrual period 12/12/2010, currently breastfeeding. Physical exam: Calm, no distress, lungs clear bilaterally, AP RRR, HEENT Breast exam WNL no redness, dimpling, or heat to area, WNL abd soft, nt, bowel sounds active no edema to lower extremities, DTRS WNL   Assessment/Plan: Normal annual exam Discussed routine health maintence, pap today, declined gc/chl, f/o annually and prn. Alexandra Sullivan 12/01/2011, 12:51 PM

## 2011-12-01 NOTE — Progress Notes (Signed)
Regular Periods: no Mammogram: no  Monthly Breast Ex.: yes Exercise: yes  Tetanus < 10 years: yes Seatbelts: yes  NI. Bladder Functn.: yes Abuse at home: no  Daily BM's: yes Stressful Work: no  Healthy Diet: yes Sigmoid-Colonoscopy: n/a  Calcium: no Medical problems this year: n/a   LAST PAP: 02/2010 WNL per pt   Contraception: June 14   Mammogram:  N/a   PCP: [facility]   PMH: no changes    FMH: no changes   Last Bone Scan: n/a  Pt c/o L breast inflamed & painful yesterday no fever. Pt informed the Lactation Consultant & she states warm compressions, rest, and take Lecithin. Pt states inflammation & pain went away; however, wants Midwife suggestion how to prevent it from happening again.

## 2011-12-07 LAB — PAP IG W/ RFLX HPV ASCU

## 2012-08-03 ENCOUNTER — Telehealth: Payer: Self-pay | Admitting: Obstetrics and Gynecology

## 2012-08-03 NOTE — Telephone Encounter (Signed)
Tc to pt regarding msg.  Pt states her baby is now 11 months, she is BFing and pumping and donating breast milk.  Pt states that she is losing weight.  In November she was 124 lbs and today she is 112 lbs.  Pt advised to see her PCP, she may need some blood work done.  Pt voices agreement, will call if needed.

## 2014-05-13 ENCOUNTER — Encounter: Payer: Self-pay | Admitting: Obstetrics and Gynecology

## 2017-12-03 ENCOUNTER — Emergency Department (HOSPITAL_BASED_OUTPATIENT_CLINIC_OR_DEPARTMENT_OTHER)
Admission: EM | Admit: 2017-12-03 | Discharge: 2017-12-03 | Disposition: A | Discharge status: Home / Self Care | Payer: 59 | Attending: Emergency Medicine | Admitting: Emergency Medicine

## 2017-12-03 ENCOUNTER — Encounter (HOSPITAL_BASED_OUTPATIENT_CLINIC_OR_DEPARTMENT_OTHER): Payer: Self-pay | Admitting: *Deleted

## 2017-12-03 ENCOUNTER — Other Ambulatory Visit: Payer: Self-pay

## 2017-12-03 DIAGNOSIS — M5431 Sciatica, right side: Secondary | ICD-10-CM | POA: Diagnosis not present

## 2017-12-03 DIAGNOSIS — M79604 Pain in right leg: Secondary | ICD-10-CM | POA: Diagnosis present

## 2017-12-03 DIAGNOSIS — Z79899 Other long term (current) drug therapy: Secondary | ICD-10-CM | POA: Insufficient documentation

## 2017-12-03 NOTE — ED Triage Notes (Signed)
Pt reports she has been dealing with right side hip leg pain x 4 weeks. She has hx of sciatica. She is here tonight bc she noticed swelling in her right calf, and "it feels numb"

## 2017-12-03 NOTE — Discharge Instructions (Signed)
Alternate 600 mg of ibuprofen and [id number] mg of Tylenol every 3 hours as needed for pain. Do not exceed 4000 mg of Tylenol daily.  Take ibuprofen with food to avoid upset stomach issues.  Alternatively you can take tumeric instead of ibuprofen.  Take some hot showers or hot baths to avoid muscle stiffness, do some gentle stretching exercises which have attached to this information.  You may return tomorrow for ultrasound study.   Follow-up with an orthopedic physician for reevaluation of your symptoms.  Return to the emergency department if any concerning signs or symptoms develop such as worsening pain, fevers, severe swelling, loss of pulses.

## 2017-12-03 NOTE — ED Provider Notes (Signed)
[facility] EMERGENCY DEPARTMENT Provider Note   CSN: [id number] Arrival date & time: 12/03/17  2106     History   Chief Complaint Chief Complaint  Patient presents with  . Leg Pain    HPI Alexandra Sullivan is a 32 y.o. female with history of anxiety disorder presents today for evaluation of gradual onset, waxing and waning right sided low back and leg pain for 4 weeks.  She states that pain is worse in the mornings and at night before bed.  Pain is sharp and aching and travels down the posterior aspect of the right lower extremity into the toes.  Pain worsens with position changes improves somewhat laying flat on her back.  She has been going to a chiropractor and physical therapy with improvement in her symptoms.  She presents today because she noticed that her right calf appears larger than her left and she feels "an abnormal feeling "to the posterior aspect of the calf and the plantar aspect of the right foot.  She noticed these sensations today.  No medications prior to arrival.  She denies bowel or bladder incontinence, saddle anesthesia, IV drug use, fevers, recent travel or surgeries, hemoptysis, prior history of DVT or PE, shortness of breath, or OCPs.  The history is provided by the patient.    Past Medical History:  Diagnosis Date  . Blood type, Rh negative   . H/O anxiety disorder   . UTI (lower urinary tract infection)     Patient Active Problem List   Diagnosis Date Noted  . Vaginal delivery 10/22/2011  . Blood type, Rh negative 10/21/2011  . Anxiety disorder 10/21/2011  . Subchorionic hemorrhage 10/21/2011  . Postive Antibody Screen 02/05/2011    Past Surgical History:  Procedure Laterality Date  . wisdom teeth      age 44     OB History    Gravida  2   Para  2   Term  2   Preterm      AB      Living  2     SAB      TAB      Ectopic      Multiple      Live Births  2            Home Medications    Prior to  Admission medications   Medication Sig Start Date End Date Taking? Authorizing Provider  cephALEXin (KEFLEX) 500 MG capsule Take 500 mg by mouth 4 (four) times daily.    [provider]  Docosahexaenoic Acid (DHA OMEGA 3 PO) Take 1 capsule by mouth 2 (two) times daily.      [provider]  LECITHIN PO Take by mouth as needed.    [provider]  Prenatal Vit-Fe Fumarate-FA (PRENATAL MULTIVITAMIN) TABS Take 1 tablet by mouth daily.    [provider]    Family History Family History  Problem Relation Age of Onset  . Diabetes Maternal Grandmother   . Hypertension Maternal Grandmother   . Diabetes Maternal Grandfather   . Hypertension Maternal Grandfather   . Diabetes Paternal Grandmother   . Hypertension Paternal Grandmother   . Cancer Paternal Grandmother   . Diabetes Paternal Grandfather   . Hypertension Paternal Grandfather     Social History Social History   Tobacco Use  . Smoking status: Never Smoker  . Smokeless tobacco: Never Used  Substance Use Topics  . Alcohol use: Yes    Comment: occasional  .  Drug use: No     Allergies   Patient has no known allergies.   Review of Systems Review of Systems  Constitutional: Negative for chills and fever.  Respiratory: Negative for shortness of breath.   Cardiovascular: Positive for leg swelling (?). Negative for chest pain.  Musculoskeletal: Positive for back pain.  Neurological: Positive for numbness.  All other systems reviewed and are negative.    Physical Exam Updated Vital Signs BP 128/87 (BP Location: Right Arm)   Pulse 81   Temp 98.5 F (36.9 C) (Oral)   Resp 16   Ht  (1.549 m)   Wt 57.6 kg (127 lb)   LMP 11/07/2017   SpO2 100%   Breastfeeding? No   BMI 24.00 kg/m   Physical Exam  Constitutional: She appears well-developed and well-nourished. No distress.  HENT:  Head: Normocephalic and atraumatic.  Eyes: Conjunctivae are normal. Right eye exhibits no  discharge. Left eye exhibits no discharge.  Neck: No JVD present. No tracheal deviation present.  Cardiovascular: Normal rate and intact distal pulses.  2+ DP/PT pulses bilaterally.  Left calf measures 35 cm around the widest part, right calf measures 36 cm around the widest part.  No edema noted.  No palpable cords or color changes to the right lower extremity as compared to the left.  No tenderness to palpation of the calves bilaterally, Homans sign absent bilaterally  Pulmonary/Chest: Effort normal.  Abdominal: She exhibits no distension.  Musculoskeletal: She exhibits no edema.  No midline spine TTP, no paraspinal muscle tenderness, no deformity, crepitus, or step-off noted.  5/5 strength of BLE major muscle groups.  Positive straight leg raise on the right.  Neurological: She is alert. A sensory deficit is present. She exhibits normal muscle tone.  Fluent speech, no facial droop, altered sensation to soft touch of the posterior aspect of the right lower extremity from the knee to the toes.  Otherwise sensation is intact to soft touch of extremities.  Patient is ambulatory with good gait and balance, able to Heel Walk and Toe Walk without difficulty.  Skin: Skin is warm and dry. No erythema.  Psychiatric: She has a normal mood and affect. Her behavior is normal.  Nursing note and vitals reviewed.    ED Treatments / Results  Labs (all labs ordered are listed, but only abnormal results are displayed) Labs Reviewed - No data to display  EKG None  Radiology No results found.  Procedures Procedures (including critical care time)  Medications Ordered in ED Medications - No data to display   Initial Impression / Assessment and Plan / ED Course  I have reviewed the triage vital signs and the nursing notes.  Pertinent labs & imaging results that were available during my care of the patient were reviewed by me and considered in my medical decision making (see chart for details).      Patient presents with ongoing sciatic/radicular pain of the right lower extremity for 4 weeks.  Today she noticed that her right calf appeared somewhat swollen as compared to the left and felt paresthesias of the calf and the plantar aspect of the right foot.  She is afebrile, vital signs are stable.  She is nontoxic in appearance.  No red flag signs concerning for cauda equina.  She does have some altered sensation to the posterior aspect of the right lower extremity extending to the plantar aspect of the right foot.  She is ambulatory without difficulty.  She is extremely low risk for  DVT.  Ultrasound is not available at this time but I did instruct patient to return tomorrow for DVT study if she continued to have concern regarding the possibility of a blood clot.  She denies shortness of breath and I doubt PE.  She refuses any medications although we did discuss the utility of anti-inflammatories and Tylenol for her symptoms.  She is following up with a chiropractor and a physical therapist and they have been helpful.  She has no midline tenderness to palpation of the back.  Stable for discharge home with follow-up with orthopedics for reevaluation of symptoms.  Discussed strict ED return precautions.  Patient and patient's husband verbalized understanding of and agreement with plan and patient is stable for discharge home at this time.  Final Clinical Impressions(s) / ED Diagnoses   Final diagnoses:  Sciatica of right side    ED Discharge Orders        Ordered    US Venous Img Lower Bilateral     12/03/17 2147       Bennye Alm 12/03/17 2151    Jacalyn Lefevre, MD 12/03/17 2241

## 2017-12-03 NOTE — ED Notes (Signed)
Discharge instructions reviewed, pt verbalized understanding. U/S called to schedule appointment for outpatient U/S as ordered.

## 2017-12-04 ENCOUNTER — Ambulatory Visit (HOSPITAL_BASED_OUTPATIENT_CLINIC_OR_DEPARTMENT_OTHER)
Admission: RE | Admit: 2017-12-04 | Discharge: 2017-12-04 | Disposition: A | Discharge status: Home / Self Care | Payer: 59 | Source: Ambulatory Visit | Attending: Physician Assistant | Admitting: Physician Assistant

## 2017-12-04 DIAGNOSIS — M79661 Pain in right lower leg: Secondary | ICD-10-CM | POA: Diagnosis not present

## 2019-02-02 IMAGING — US US EXTREM LOW VENOUS BILAT
1 series · 14 of 24 positions shown · non-contrast
Comparison: None

CLINICAL DATA: Right calf swelling.  History sciatica.

EXAM:
BILATERAL LOWER EXTREMITY VENOUS DOPPLER ULTRASOUND
TECHNIQUE: Gray-scale sonography with compression, as well as color and duplex
ultrasound, were performed to evaluate the deep venous system from
the level of the common femoral vein through the popliteal and
proximal calf veins.

[Series 1: us extrem low venous bilat · 0.05mm/px · 14 of 55 slices shown]
[im 1/55]
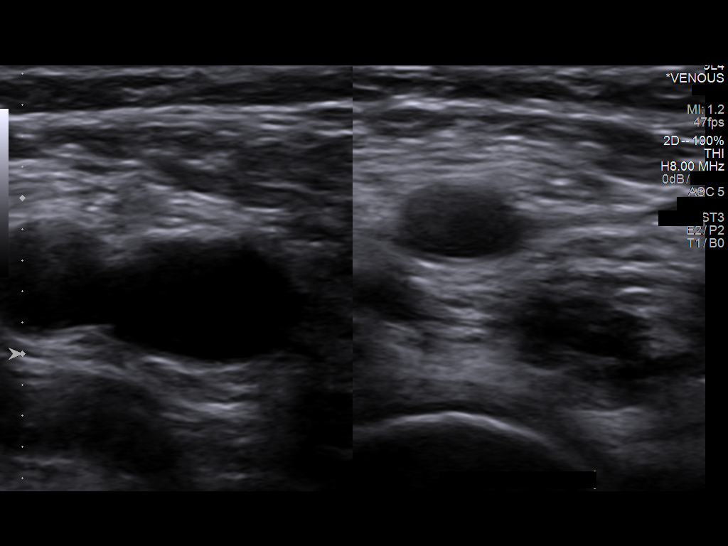
[im 5/55]
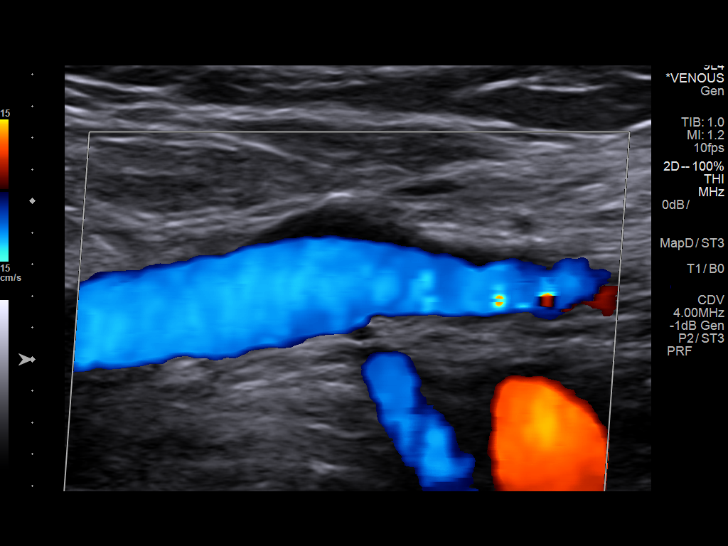
[im 10/55]
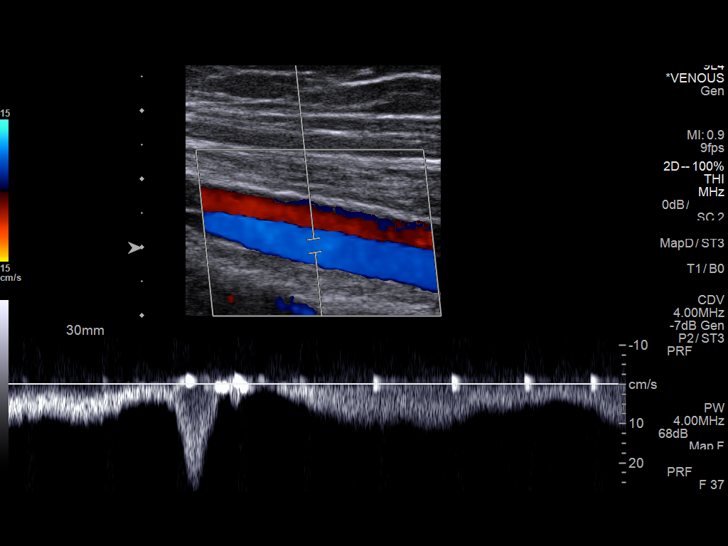
[im 15/55]
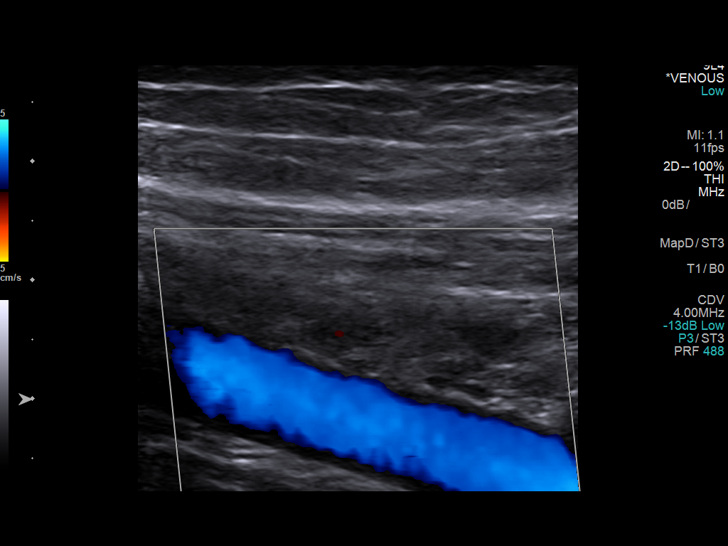
[im 17/55]
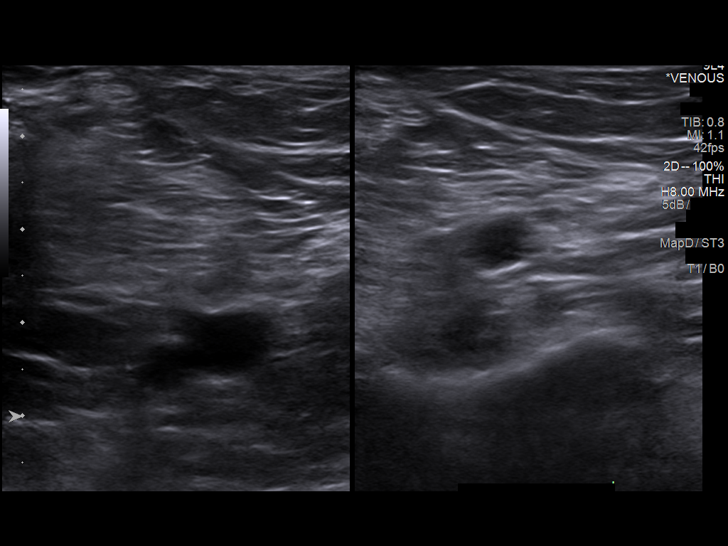
[im 22/55]
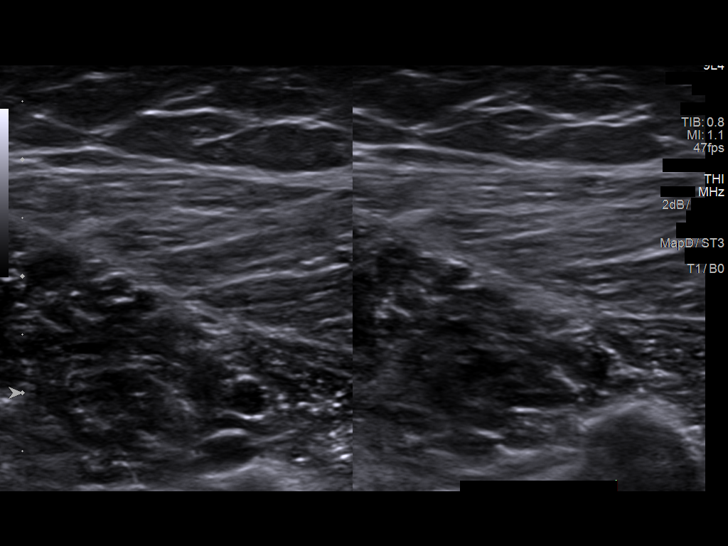
[im 26/55]
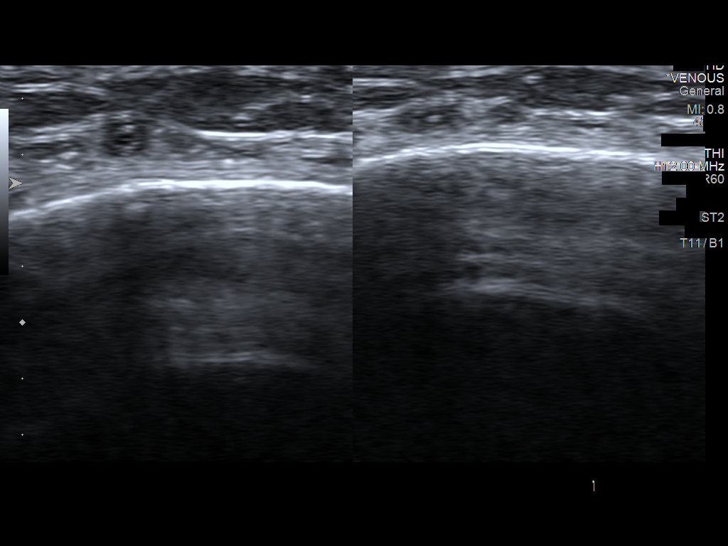
[im 29/55]
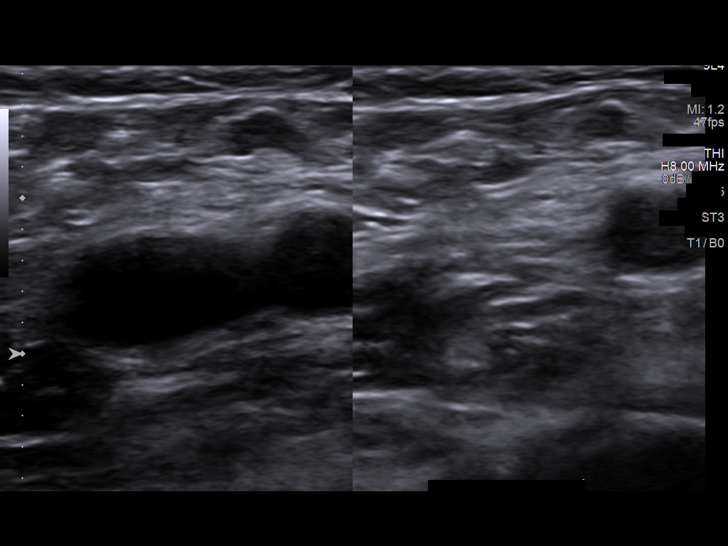
[im 33/55]
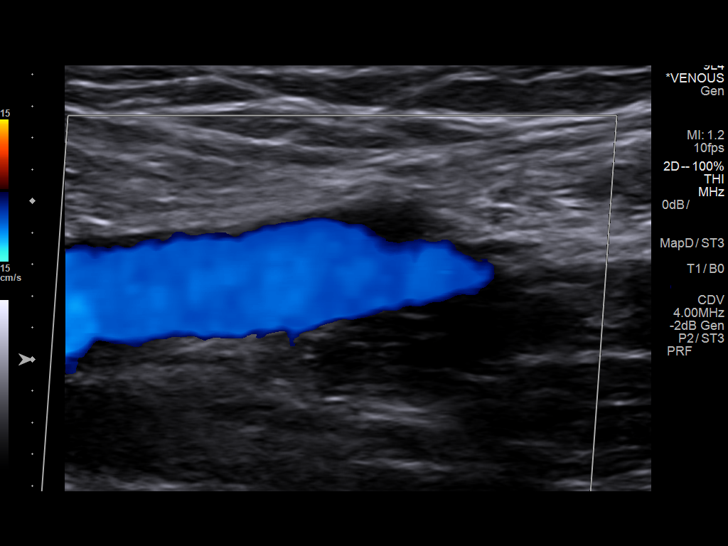
[im 38/55]
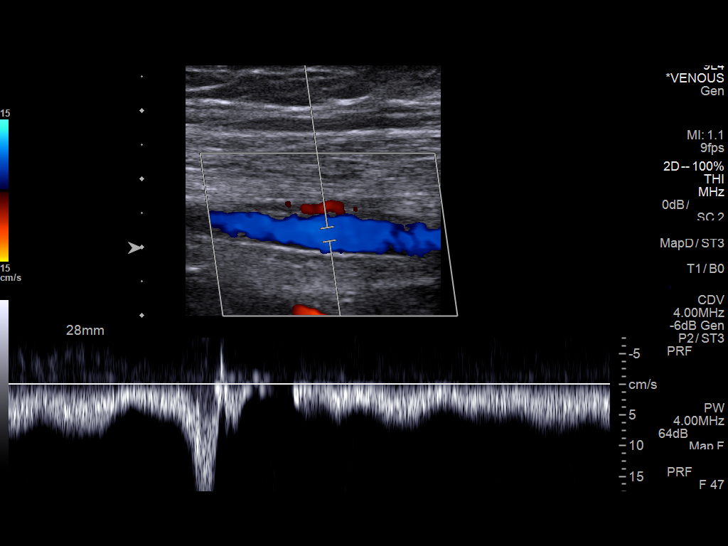
[im 43/55]
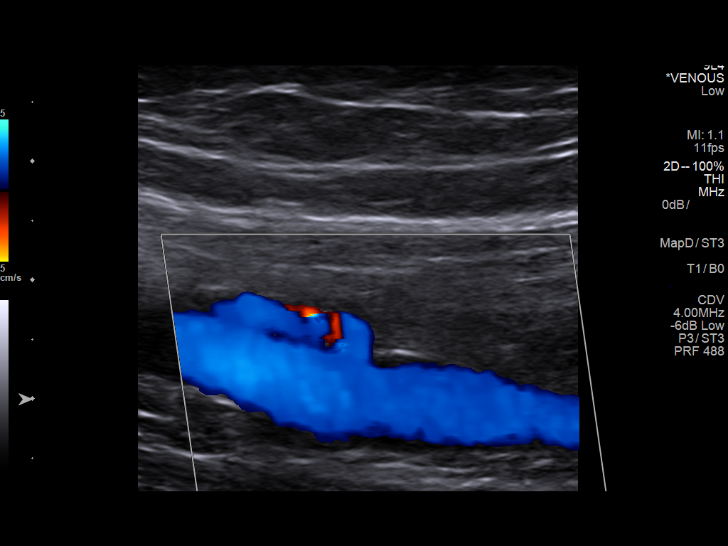
[im 45/55]
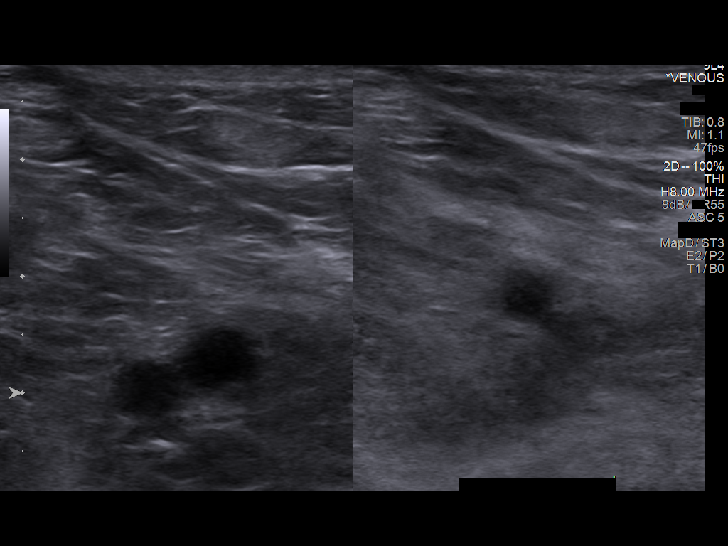
[im 50/55]
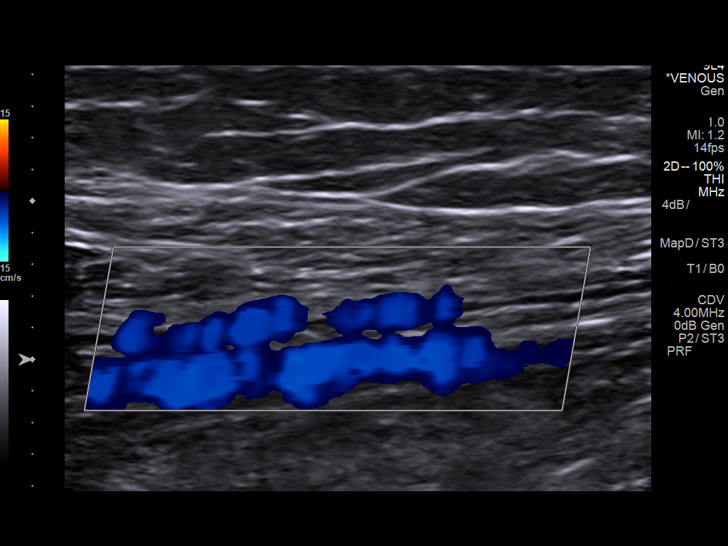
[im 55/55]
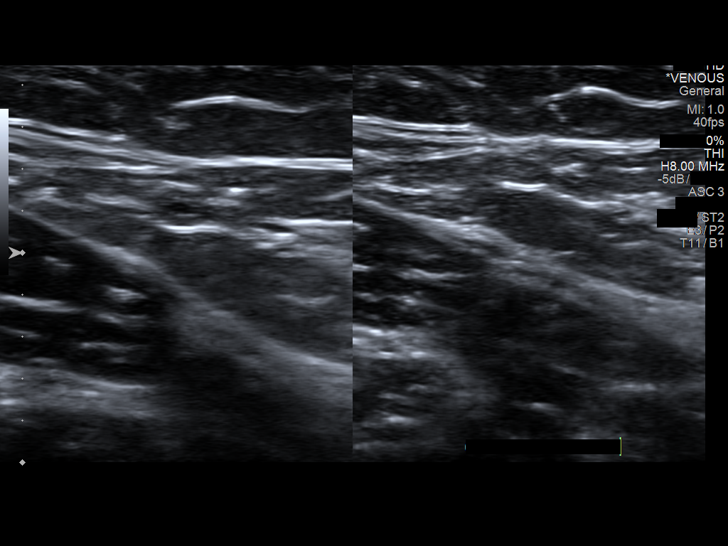

[14 of 24 positions shown; findings below may reference images not displayed]

FINDINGS: Normal compressibility of the common femoral, superficial femoral,
and popliteal veins, as well as the proximal calf veins. No filling
defects to suggest DVT on grayscale or color Doppler imaging.
Doppler waveforms show normal direction of venous flow, normal
respiratory phasicity and response to augmentation. Visualized
segments of the saphenous venous systems normal in caliber and
compressibility.
IMPRESSION: No evidence of  lower extremity deep vein thrombosis, bilaterally.

## 2023-03-13 ENCOUNTER — Other Ambulatory Visit: Payer: Self-pay

## 2023-03-13 ENCOUNTER — Emergency Department (HOSPITAL_COMMUNITY)
Admission: EM | Admit: 2023-03-13 | Discharge: 2023-03-13 | Discharge status: Against Medical Advice | Payer: 59 | Attending: Emergency Medicine | Admitting: Emergency Medicine

## 2023-03-13 ENCOUNTER — Encounter (HOSPITAL_COMMUNITY): Payer: Self-pay | Admitting: Emergency Medicine

## 2023-03-13 DIAGNOSIS — M79604 Pain in right leg: Secondary | ICD-10-CM | POA: Insufficient documentation

## 2023-03-13 DIAGNOSIS — R202 Paresthesia of skin: Secondary | ICD-10-CM | POA: Insufficient documentation

## 2023-03-13 DIAGNOSIS — Z5321 Procedure and treatment not carried out due to patient leaving prior to being seen by health care provider: Secondary | ICD-10-CM | POA: Insufficient documentation

## 2023-03-13 NOTE — ED Notes (Signed)
Patient informed registration staff member that she no longer wanted to wait to be seen by MD.  RN unable to speak to patient prior to patient leaving.

## 2023-03-13 NOTE — ED Triage Notes (Signed)
Patient coming to ED for evaluation of R leg pain x 4 weeks.  Reports pain radiates from buttocks down leg and into foot.  Having numbness in foot.  C/o redness to buttocks.  Has tried chiropractor and dry needling without improvement.  Reports she developed a fever a few days ago and this concerned her.   No cough.  Unknown if she had a fever today.

## 2023-03-15 ENCOUNTER — Other Ambulatory Visit: Payer: Self-pay

## 2023-03-15 ENCOUNTER — Emergency Department (HOSPITAL_COMMUNITY)
Admission: EM | Admit: 2023-03-15 | Discharge: 2023-03-15 | Disposition: A | Discharge status: Home / Self Care | Payer: Medicaid Other | Attending: Emergency Medicine | Admitting: Emergency Medicine

## 2023-03-15 DIAGNOSIS — M5431 Sciatica, right side: Secondary | ICD-10-CM | POA: Insufficient documentation

## 2023-03-15 MED ORDER — IBUPROFEN 800 MG PO TABS
800.0000 mg | ORAL_TABLET | Freq: Two times a day (BID) | ORAL | 0 refills | Status: DC
Start: 2023-03-15 — End: 2023-03-15

## 2023-03-15 MED ORDER — CYCLOBENZAPRINE HCL 10 MG PO TABS: 10.0000 mg | ORAL_TABLET | Freq: Two times a day (BID) | ORAL | 0 refills | Status: AC | PRN

## 2023-03-15 MED ORDER — PREDNISONE 20 MG PO TABS
40.0000 mg | ORAL_TABLET | Freq: Every day | ORAL | 0 refills | Status: AC
Start: 2023-03-15 — End: 2023-03-20

## 2023-03-15 MED ORDER — PREDNISONE 20 MG PO TABS
40.0000 mg | ORAL_TABLET | Freq: Every day | ORAL | 0 refills | Status: DC
Start: 2023-03-15 — End: 2023-03-15

## 2023-03-15 MED ORDER — IBUPROFEN 800 MG PO TABS
800.0000 mg | ORAL_TABLET | Freq: Two times a day (BID) | ORAL | 0 refills | Status: AC
Start: 2023-03-15 — End: 2023-03-22

## 2023-03-15 MED ORDER — CYCLOBENZAPRINE HCL 10 MG PO TABS
10.0000 mg | ORAL_TABLET | Freq: Two times a day (BID) | ORAL | 0 refills | Status: DC | PRN
Start: 2023-03-15 — End: 2023-03-15

## 2023-03-15 NOTE — ED Provider Notes (Signed)
[place] EMERGENCY DEPARTMENT AT [facility] Provider Note   CSN: [id number] Arrival date & time: 03/15/23  0321     History  No chief complaint on file.   Alexandra Sullivan is a 37 y.o. female.  HPI   Patient with medical history including anxiety, history of sciatica, presenting with complaints of sciatic nerve pain.  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  Home Medications Prior to Admission medications   Medication Sig Start Date End Date Taking? Authorizing Provider  cephALEXin (KEFLEX) 500 MG capsule Take 500 mg by mouth 4 (four) times daily.    [provider]  cyclobenzaprine (FLEXERIL) 10 MG tablet Take 1 tablet (10 mg total) by mouth 2 (two) times daily as needed for muscle spasms. 03/15/23   Carroll Sage, PA-C  Docosahexaenoic Acid (DHA OMEGA 3 PO) Take 1 capsule by mouth 2 (two) times daily.      [provider]  ibuprofen (ADVIL) 800 MG tablet Take 1 tablet (800 mg total) by mouth 2 (two) times daily for 7 days. 03/15/23 03/22/23  Carroll Sage, PA-C  LECITHIN PO Take by mouth as needed.    [provider]  predniSONE (DELTASONE) 20 MG tablet Take 2 tablets (40 mg total) by mouth daily for 5 days. 03/15/23 03/20/23  Carroll Sage, PA-C  Prenatal Vit-Fe Fumarate-FA (PRENATAL MULTIVITAMIN) TABS Take 1 tablet by mouth daily.    [provider]      Allergies    Patient has no known allergies.    Review of Systems   Review of Systems  Constitutional:  Negative for  chills and fever.  Respiratory:  Negative for shortness of breath.   Cardiovascular:  Negative for chest pain.  Gastrointestinal:  Negative for abdominal pain.  Musculoskeletal:  Positive for back pain.  Neurological:  Negative for headaches.    Physical Exam Updated Vital Signs BP 104/74 (BP Location: Left Arm)   Pulse 84   Temp 98.5 F (36.9 C) (Oral)   Resp 14   SpO2 98%  Physical Exam Vitals and nursing note reviewed.  Constitutional:      General: She is not in acute distress.    Appearance: She is not ill-appearing.  HENT:     Head: Normocephalic and atraumatic.     Nose: No congestion.  Eyes:     Conjunctiva/sclera: Conjunctivae normal.  Cardiovascular:     Rate and Rhythm: Normal rate and regular rhythm.     Pulses: Normal pulses.  Musculoskeletal:     Comments: Spine was palpated was nontender to palpation no step-off deformities noted no pelvis instability no leg shortening, patient has pain which is focalized and reproducible on her right buttocks, she has 5 out of 5 strength neurovasc intact in lower extremities, she has 2+ dorsal pedal pulses, 2+ patellar reflexes.  Skin:    General: Skin is warm and dry.  Neurological:     Mental Status: She is alert.  Psychiatric:        Mood and Affect: Mood normal.  ED Results / Procedures / Treatments   Labs (all labs ordered are listed, but only abnormal results are displayed) Labs Reviewed - No data to display  EKG None  Radiology No results found.  Procedures Procedures    Medications Ordered in ED Medications - No data to display  ED Course/ Medical Decision Making/ A&P                                 Medical Decision Making  This patient presents to the ED for concern of back pain, this involves an extensive number of treatment options, and is a complaint that carries with it a high risk of complications and morbidity.  The differential diagnosis includes AAA dissection cauda  equina    Additional history obtained:  Additional history obtained from N/A External records from outside source obtained and reviewed including recent ER notes   Co morbidities that complicate the patient evaluation  N/A  Social Determinants of Health:  No PCP    Lab Tests:  I Ordered, and personally interpreted labs.  The pertinent results include: N/A   Imaging Studies ordered:  I ordered imaging studies including N/A I independently visualized and interpreted imaging which showed N/A I agree with the radiologist interpretation   Cardiac Monitoring:  The patient was maintained on a cardiac monitor.  I personally viewed and interpreted the cardiac monitored which showed an underlying rhythm of: N/A   Medicines ordered and prescription drug management:  I ordered medication including N/A I have reviewed the patients home medicines and have made adjustments as needed  Critical Interventions:  N/A   Reevaluation:  Presents with back pain, she has a benign physical exam, offered her pain medication but states she would like to go home, will discharge at this time.   Consultations Obtained:  N/a    Test Considered:  Imaging of the hip-deferred as my suspicion for fracture very low at this time no traumatic injury, not increased risk for pathological fractures.    Rule out I have low suspicion for spinal fracture or spinal cord abnormality as patient denies urinary incontinency, retention, difficulty with bowel movements, denies saddle paresthesias.  Spine was palpated there is no step-off, crepitus or gross deformities felt, patient had 5/5 strength, full range of motion, neurovascular fully intact in the lower extremities.  Doubt AAA or dissection presentation atypical, pain is focalized and reproducible.. . Low suspicion for septic arthritis as patient denies IV drug use, skin exam was performed no erythematous, edema or warm joints  noted.    Dispostion and problem list  After consideration of the diagnostic results and the patients response to treatment, I feel that the patent would benefit from discharge.    Sciatica-will start a course of steroids, provide muscle laxer, anti-inflammatories, will have her follow-up with neurosurgery/orthopedics for further evaluation and strict return precautions.   .         Final Clinical Impression(s) / ED Diagnoses Final diagnoses:  Sciatica of right side    Rx / DC Orders ED Discharge Orders          Ordered    predniSONE (DELTASONE) 20 MG tablet  Daily,   Status:  Discontinued        03/15/23 0402    cyclobenzaprine (FLEXERIL) 10 MG tablet  2 times daily PRN,   Status:  Discontinued        03/15/23 0402    ibuprofen (  ADVIL) 800 MG tablet  2 times daily,   Status:  Discontinued        03/15/23 0402    cyclobenzaprine (FLEXERIL) 10 MG tablet  2 times daily PRN        03/15/23 0403    ibuprofen (ADVIL) 800 MG tablet  2 times daily        03/15/23 0403    predniSONE (DELTASONE) 20 MG tablet  Daily        03/15/23 0403              Carroll Sage, PA-C 03/15/23 0404    Gilda Crease, MD 03/16/23 [id number]

## 2023-03-15 NOTE — ED Triage Notes (Signed)
Pt BIB EMS, C/o of R leg pain x 4 weeks. Reports pain radiates from buttocks down leg and into foot.   Per Ems pt went to [facility] yesterday, but left prior to being seen.

## 2023-03-15 NOTE — Discharge Instructions (Signed)
You have been seen here for back pain. I recommend taking over-the-counter pain medications like ibuprofen and/or Tylenol every 6 as needed.  Please follow dosage and on the back of bottle.  I also recommend applying heat to the area and stretching out the muscles as this will help decrease stiffness and pain.  I have given you information on exercises please follow.    I also recommend applying heat to the area and stretching out the muscles as this will help decrease stiffness and pain.  I have given you information on exercises please follow.  Please follow-up with neurosurgery for further assessment  Come back to the emergency department if you develop chest pain, shortness of breath, severe abdominal pain, uncontrolled nausea, vomiting, diarrhea.

## 2024-07-09 ENCOUNTER — Emergency Department (HOSPITAL_COMMUNITY)
Admission: EM | Admit: 2024-07-09 | Discharge: 2024-07-10 | Attending: Emergency Medicine | Admitting: Emergency Medicine

## 2024-07-09 ENCOUNTER — Other Ambulatory Visit: Payer: Self-pay

## 2024-07-09 DIAGNOSIS — S00212A Abrasion of left eyelid and periocular area, initial encounter: Secondary | ICD-10-CM | POA: Diagnosis present

## 2024-07-09 DIAGNOSIS — W5503XA Scratched by cat, initial encounter: Secondary | ICD-10-CM | POA: Insufficient documentation

## 2024-07-09 DIAGNOSIS — Z5321 Procedure and treatment not carried out due to patient leaving prior to being seen by health care provider: Secondary | ICD-10-CM | POA: Insufficient documentation

## 2024-07-09 NOTE — ED Triage Notes (Signed)
 Patient presents with left eye redness (abrasion) and mild blurred vision , scratched by her cat this evening , no bleeding or vision loss.

## 2024-07-10 NOTE — ED Notes (Signed)
Pt stated she was leaving.
